# Patient Record
Sex: Male | Born: 1960 | Race: White | Hispanic: No | Marital: Married | Smoking: Never smoker
Health system: Southern US, Community
[De-identification: ages and names within clinical notes are randomized; demographics above are authoritative.]

## PROBLEM LIST (undated history)

## (undated) DIAGNOSIS — Z86010 Personal history of colonic polyps: Secondary | ICD-10-CM

## (undated) DIAGNOSIS — R809 Proteinuria, unspecified: Secondary | ICD-10-CM

## (undated) DIAGNOSIS — E119 Type 2 diabetes mellitus without complications: Secondary | ICD-10-CM

## (undated) DIAGNOSIS — I10 Essential (primary) hypertension: Secondary | ICD-10-CM

## (undated) DIAGNOSIS — E785 Hyperlipidemia, unspecified: Secondary | ICD-10-CM

## (undated) DIAGNOSIS — B019 Varicella without complication: Secondary | ICD-10-CM

## (undated) DIAGNOSIS — N200 Calculus of kidney: Secondary | ICD-10-CM

## (undated) HISTORY — DX: Calculus of kidney: N20.0

## (undated) HISTORY — DX: Type 2 diabetes mellitus without complications: E11.9

## (undated) HISTORY — DX: Proteinuria, unspecified: R80.9

## (undated) HISTORY — DX: Essential (primary) hypertension: I10

## (undated) HISTORY — DX: Personal history of colonic polyps: Z86.010

## (undated) HISTORY — PX: HERNIA REPAIR: SHX51

## (undated) HISTORY — DX: Hyperlipidemia, unspecified: E78.5

## (undated) HISTORY — DX: Varicella without complication: B01.9

---

## 2007-01-28 ENCOUNTER — Emergency Department: Payer: Self-pay | Admitting: Emergency Medicine

## 2007-05-27 ENCOUNTER — Ambulatory Visit: Payer: Self-pay | Admitting: Internal Medicine

## 2008-01-15 ENCOUNTER — Observation Stay: Payer: Self-pay | Admitting: Specialist

## 2008-01-17 ENCOUNTER — Emergency Department: Payer: Self-pay | Admitting: Unknown Physician Specialty

## 2008-01-23 ENCOUNTER — Emergency Department: Payer: Self-pay | Admitting: Internal Medicine

## 2008-02-06 ENCOUNTER — Ambulatory Visit: Payer: Self-pay | Admitting: Specialist

## 2008-02-15 ENCOUNTER — Ambulatory Visit: Payer: Self-pay | Admitting: Specialist

## 2008-03-04 ENCOUNTER — Ambulatory Visit: Payer: Self-pay | Admitting: Specialist

## 2009-02-14 DIAGNOSIS — Z86010 Personal history of colonic polyps: Secondary | ICD-10-CM

## 2009-02-14 HISTORY — DX: Personal history of colonic polyps: Z86.010

## 2009-04-03 ENCOUNTER — Ambulatory Visit: Payer: Self-pay | Admitting: Gastroenterology

## 2009-04-03 LAB — HM COLONOSCOPY

## 2009-10-17 DIAGNOSIS — E119 Type 2 diabetes mellitus without complications: Secondary | ICD-10-CM

## 2009-10-17 DIAGNOSIS — E1129 Type 2 diabetes mellitus with other diabetic kidney complication: Secondary | ICD-10-CM | POA: Insufficient documentation

## 2009-10-17 HISTORY — DX: Type 2 diabetes mellitus without complications: E11.9

## 2009-11-28 DIAGNOSIS — E785 Hyperlipidemia, unspecified: Secondary | ICD-10-CM

## 2009-11-28 HISTORY — DX: Hyperlipidemia, unspecified: E78.5

## 2011-03-09 ENCOUNTER — Ambulatory Visit: Payer: Self-pay | Admitting: Unknown Physician Specialty

## 2011-04-08 ENCOUNTER — Ambulatory Visit: Payer: Self-pay | Admitting: Unknown Physician Specialty

## 2011-04-09 LAB — PATHOLOGY REPORT

## 2011-08-03 DIAGNOSIS — N200 Calculus of kidney: Secondary | ICD-10-CM

## 2011-08-03 HISTORY — DX: Calculus of kidney: N20.0

## 2011-11-15 DIAGNOSIS — R748 Abnormal levels of other serum enzymes: Secondary | ICD-10-CM | POA: Insufficient documentation

## 2011-12-03 ENCOUNTER — Emergency Department: Payer: Self-pay | Admitting: Unknown Physician Specialty

## 2011-12-03 ENCOUNTER — Ambulatory Visit: Payer: Self-pay | Admitting: Emergency Medicine

## 2011-12-03 LAB — URINALYSIS, COMPLETE
Glucose,UR: 50 mg/dL (ref 0–75)
Leukocyte Esterase: NEGATIVE
RBC,UR: 494 /HPF (ref 0–5)
Specific Gravity: 1.023 (ref 1.003–1.030)
Squamous Epithelial: NONE SEEN
WBC UR: 68 /HPF (ref 0–5)

## 2011-12-03 LAB — CBC
HGB: 14.3 g/dL (ref 13.0–18.0)
MCH: 27.2 pg (ref 26.0–34.0)
Platelet: 199 10*3/uL (ref 150–440)

## 2011-12-03 LAB — BASIC METABOLIC PANEL
Anion Gap: 9 (ref 7–16)
Calcium, Total: 8.8 mg/dL (ref 8.5–10.1)
Co2: 24 mmol/L (ref 21–32)
EGFR (African American): 58 — ABNORMAL LOW
EGFR (Non-African Amer.): 50 — ABNORMAL LOW
Glucose: 179 mg/dL — ABNORMAL HIGH (ref 65–99)
Osmolality: 286 (ref 275–301)
Sodium: 139 mmol/L (ref 136–145)

## 2011-12-05 LAB — URINE CULTURE

## 2012-05-25 ENCOUNTER — Ambulatory Visit: Payer: Self-pay

## 2012-05-25 LAB — URINALYSIS, COMPLETE
Bacteria: NEGATIVE
Bilirubin,UR: NEGATIVE
Ketone: NEGATIVE
Ph: 5 (ref 4.5–8.0)
Specific Gravity: >=1.03 (ref 1.003–1.030)
WBC UR: NONE SEEN /HPF (ref 0–5)

## 2012-06-08 ENCOUNTER — Ambulatory Visit: Payer: Self-pay | Admitting: Urology

## 2012-06-15 ENCOUNTER — Ambulatory Visit: Payer: Self-pay | Admitting: Anesthesiology

## 2012-06-15 LAB — BASIC METABOLIC PANEL
Calcium, Total: 9.3 mg/dL (ref 8.5–10.1)
Chloride: 106 mmol/L (ref 98–107)
Co2: 26 mmol/L (ref 21–32)
Creatinine: 2.04 mg/dL — ABNORMAL HIGH (ref 0.60–1.30)
EGFR (African American): 42 — ABNORMAL LOW
Glucose: 128 mg/dL — ABNORMAL HIGH (ref 65–99)
Osmolality: 284 (ref 275–301)
Potassium: 4.6 mmol/L (ref 3.5–5.1)

## 2012-06-15 LAB — HEMOGLOBIN: HGB: 13.4 g/dL (ref 13.0–18.0)

## 2012-06-19 ENCOUNTER — Ambulatory Visit: Payer: Self-pay | Admitting: Urology

## 2012-06-19 HISTORY — PX: KIDNEY STONE SURGERY: SHX686

## 2012-07-10 ENCOUNTER — Ambulatory Visit: Payer: Self-pay | Admitting: Family Medicine

## 2012-09-05 ENCOUNTER — Ambulatory Visit: Payer: Self-pay | Admitting: Urology

## 2012-10-03 ENCOUNTER — Ambulatory Visit: Payer: Self-pay | Admitting: Urology

## 2012-10-03 LAB — BASIC METABOLIC PANEL
BUN: 28 mg/dL — ABNORMAL HIGH (ref 7–18)
Co2: 30 mmol/L (ref 21–32)
Creatinine: 1.49 mg/dL — ABNORMAL HIGH (ref 0.60–1.30)
EGFR (Non-African Amer.): 53 — ABNORMAL LOW
Sodium: 137 mmol/L (ref 136–145)

## 2012-10-03 LAB — HEMOGLOBIN A1C: Hemoglobin A1C: 6.9 % — ABNORMAL HIGH (ref 4.2–6.3)

## 2012-10-05 ENCOUNTER — Ambulatory Visit: Payer: Self-pay | Admitting: Urology

## 2013-07-28 ENCOUNTER — Ambulatory Visit: Payer: Self-pay | Admitting: Family Medicine

## 2013-07-28 LAB — RAPID STREP-A WITH REFLX: Micro Text Report: NEGATIVE

## 2014-02-12 LAB — HEPATIC FUNCTION PANEL
ALK PHOS: 60 U/L (ref 25–125)
ALT: 37 U/L (ref 10–40)
AST: 27 U/L (ref 14–40)
BILIRUBIN, TOTAL: 0.6 mg/dL

## 2014-02-12 LAB — LIPID PANEL
ALBUMIN: 4.5
Albumin/Globulin Ratio: 1.6
BUN / CREAT RATIO: 19
CALCIUM: 9.5
CHLORIDE: 101
CO2: 23
Cholesterol: 186 mg/dL (ref 0–200)
EGFR (Non-African Amer.): 50
GFR CALC AF AMER: 58
Globulin, Total: 2.9
HDL: 30 mg/dL — AB (ref 35–70)
LDL CALC: 113 mg/dL
MCH: 27.8
MCHC: 33.9
MCV: 82
PH, URINE: 6
RBC: 5.32
RDW: 14.5
SPEC GRAV UA: 1.03
Triglycerides: 215 mg/dL — AB (ref 40–160)
Urobilinogen, UA: NORMAL
VLDL Cholesterol Cal: 43

## 2014-02-12 LAB — BASIC METABOLIC PANEL
BUN: 29 mg/dL — AB (ref 4–21)
Creatinine: 1.6 mg/dL — AB (ref 0.6–1.3)
GLUCOSE: 109 mg/dL
Potassium: 4.6 mmol/L (ref 3.4–5.3)
SODIUM: 140 mmol/L (ref 137–147)

## 2014-02-12 LAB — CBC AND DIFFERENTIAL
HEMATOCRIT: 44 % (ref 41–53)
HEMOGLOBIN: 14.8 g/dL (ref 13.5–17.5)
Platelets: 214 10*3/uL (ref 150–399)
WBC: 6.1 10*3/mL

## 2014-02-12 LAB — PROTIME-INR: Protime: 7.4 seconds — AB (ref 10.0–13.8)

## 2014-02-12 LAB — PSA: PSA: 0.9

## 2014-10-31 IMAGING — CR DG ELBOW COMPLETE 3+V*L*
1 series · 4 of 4 positions shown · non-contrast
Comparison: none

REASON FOR EXAM: pain
COMMENTS:

PROCEDURE:     KDR - KDXR ELBOW LT COMP W/OBLIQUES  - July 10, 2012 [DATE]
RESULT:     Left elbow images demonstrate no definite fracture, dislocation
or radiopaque foreign body.

[Series 1: lat · 0.17mm/px · 4 of 4 slices shown]
[im 1/4]
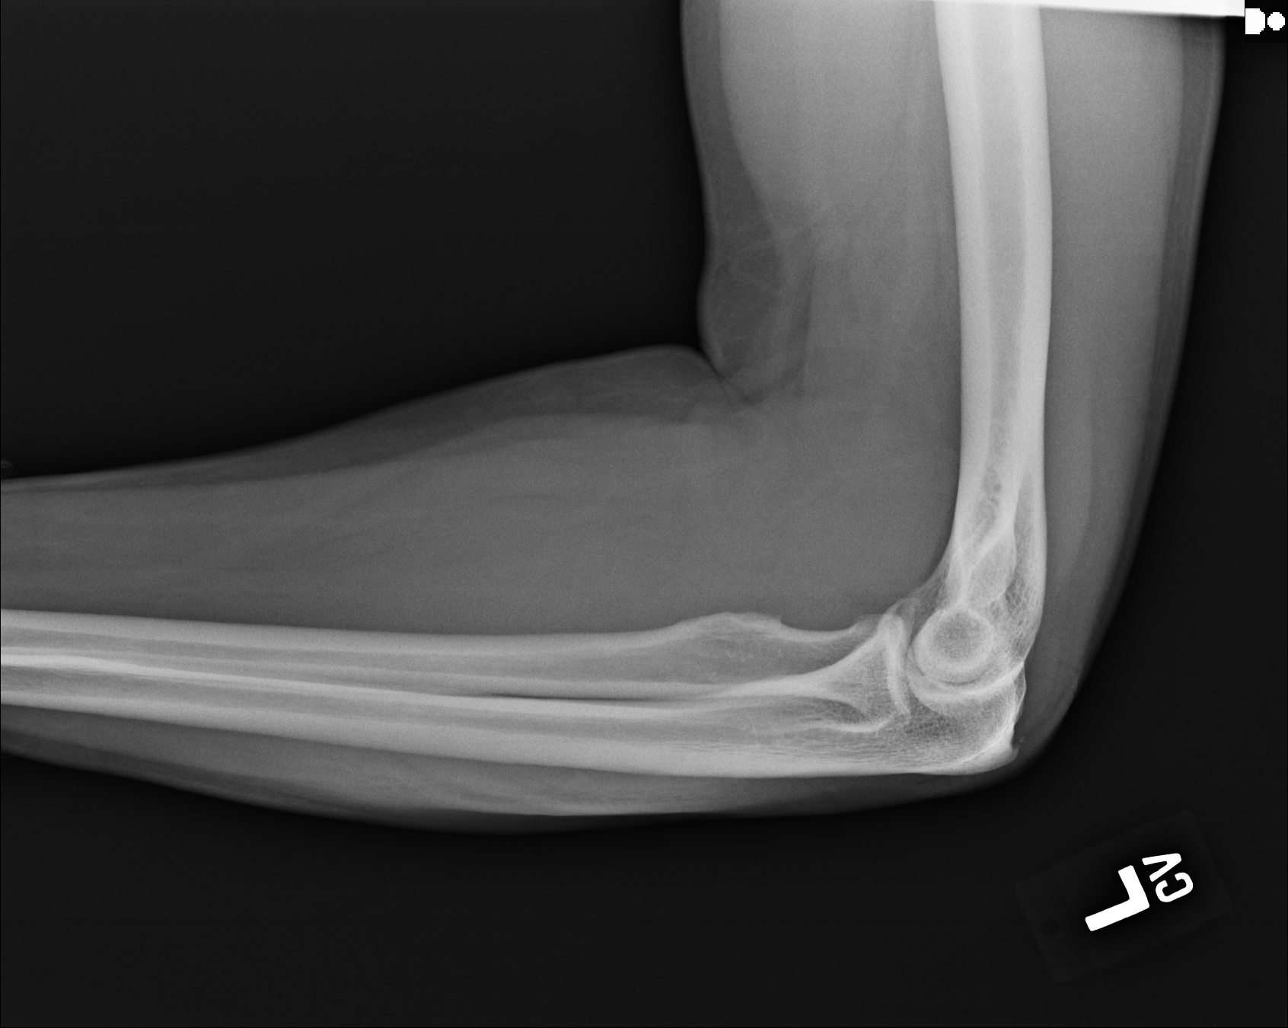
[im 2/4]
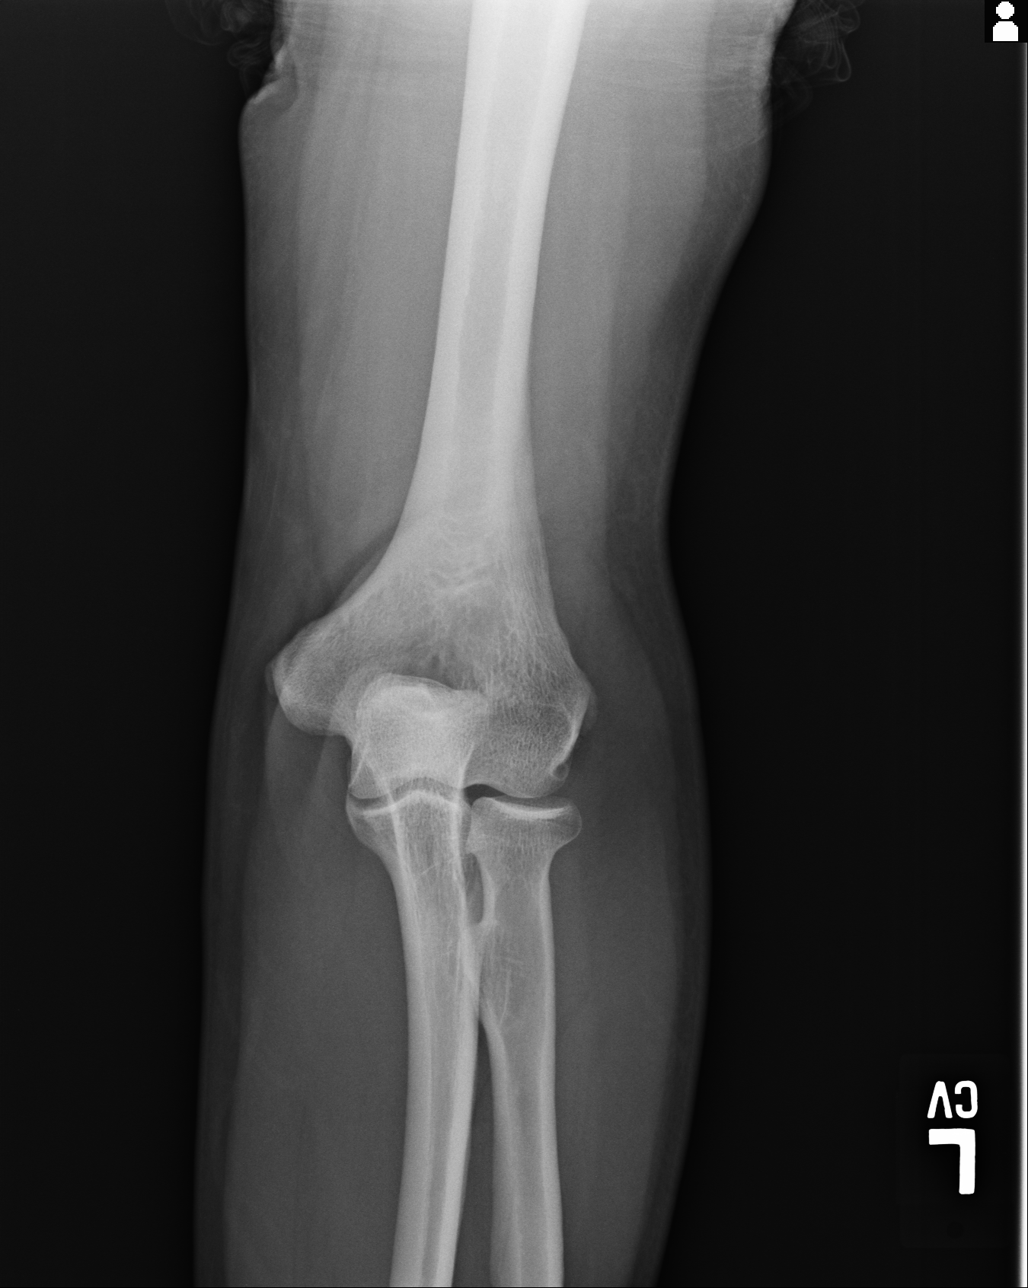
[im 3/4]
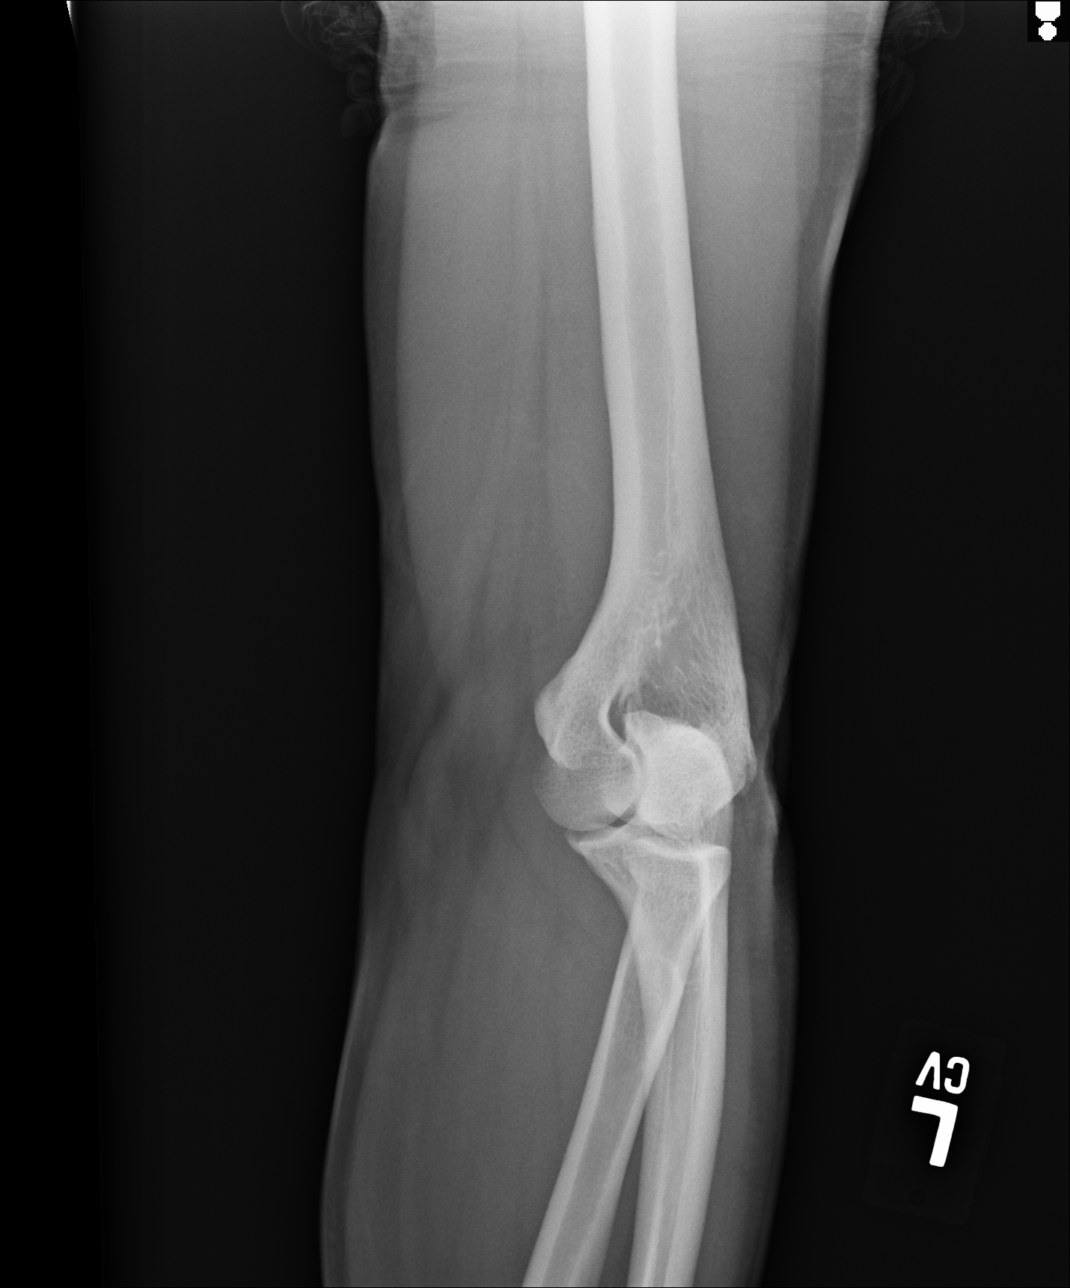
[im 4/4]
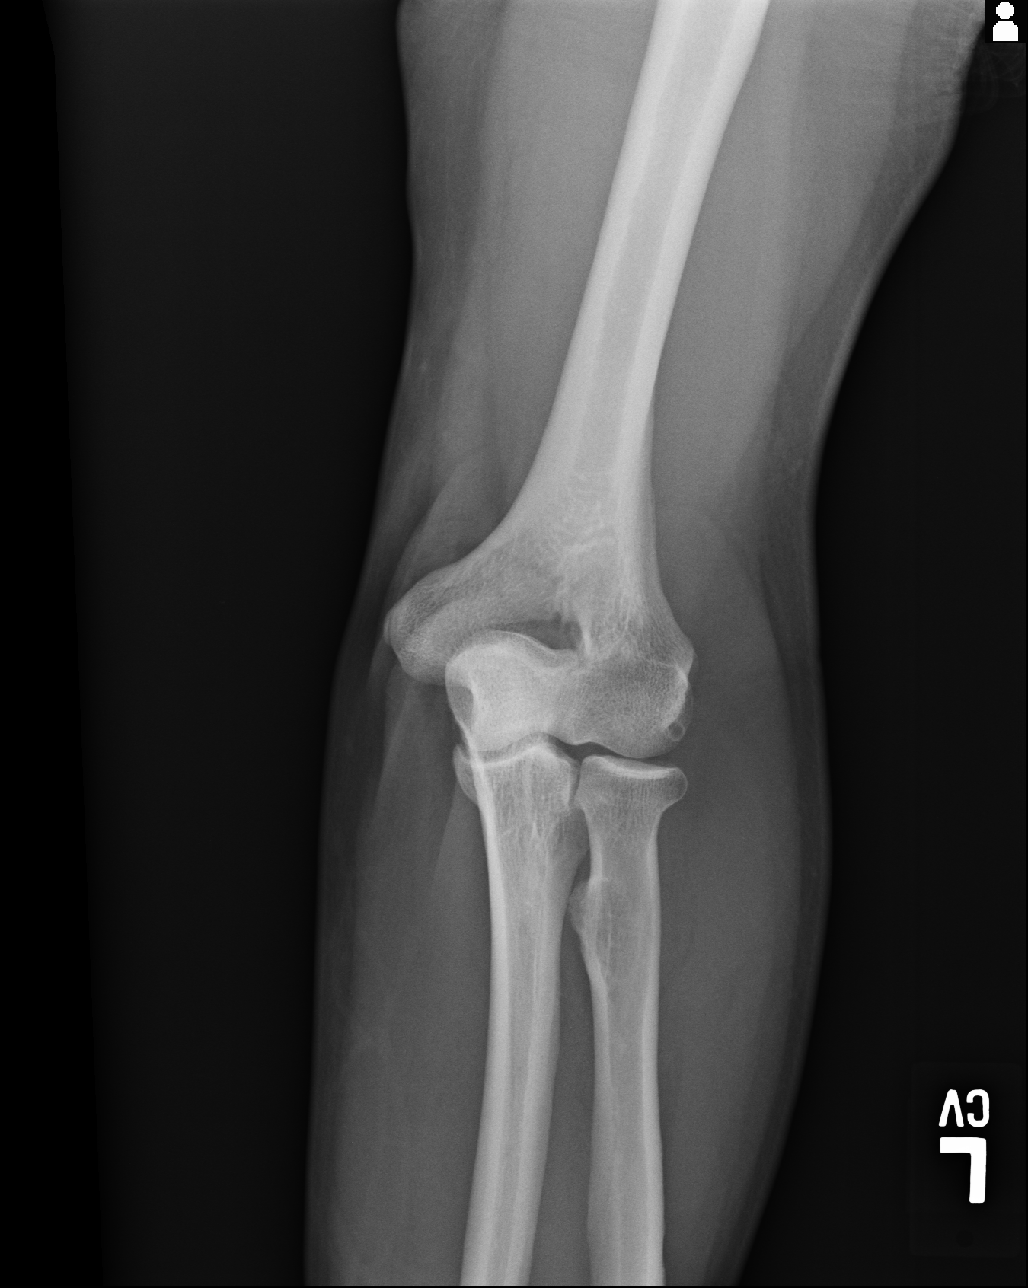

[4 of 4 positions shown; findings below may reference images not displayed]

IMPRESSION: Please see above.

[REDACTED]

## 2014-10-31 IMAGING — CR DG SHOULDER 3+V*L*
1 series · 5 of 5 positions shown · non-contrast
Comparison: none

REASON FOR EXAM: pain
COMMENTS:

PROCEDURE:     KDR - KDXR SHOULDER LEFT COMPLETE  - July 10, 2012 [DATE]
RESULT:     Images of the left shoulder demonstrate no evidence of fracture,
dislocation, lytic or sclerotic mass or bony destruction.

[Series 1: internal rotate · 0.17mm/px · 5 of 5 slices shown]
[im 1/5]
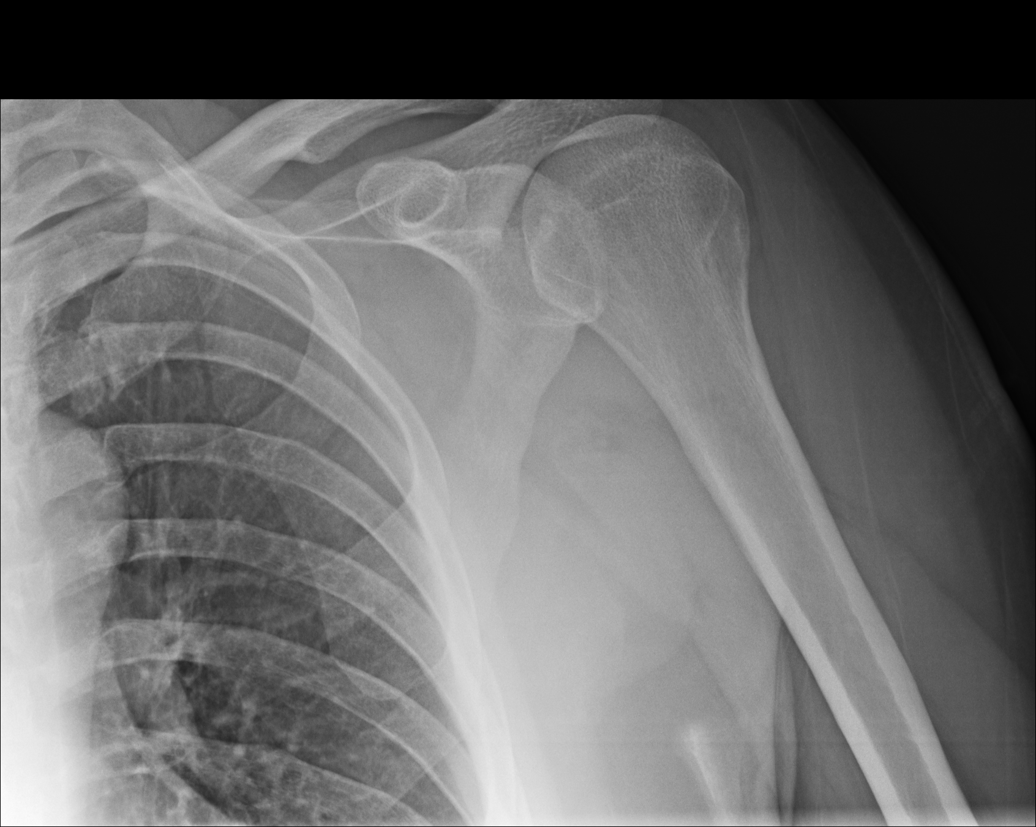
[im 2/5]
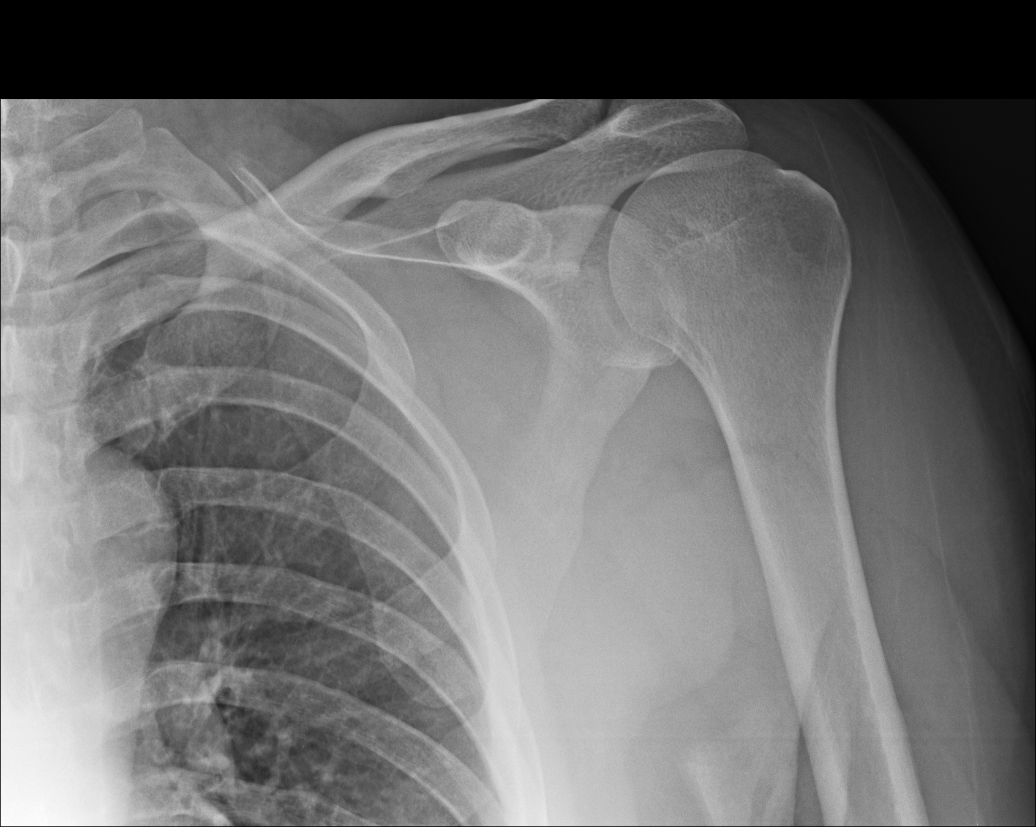
[im 3/5]
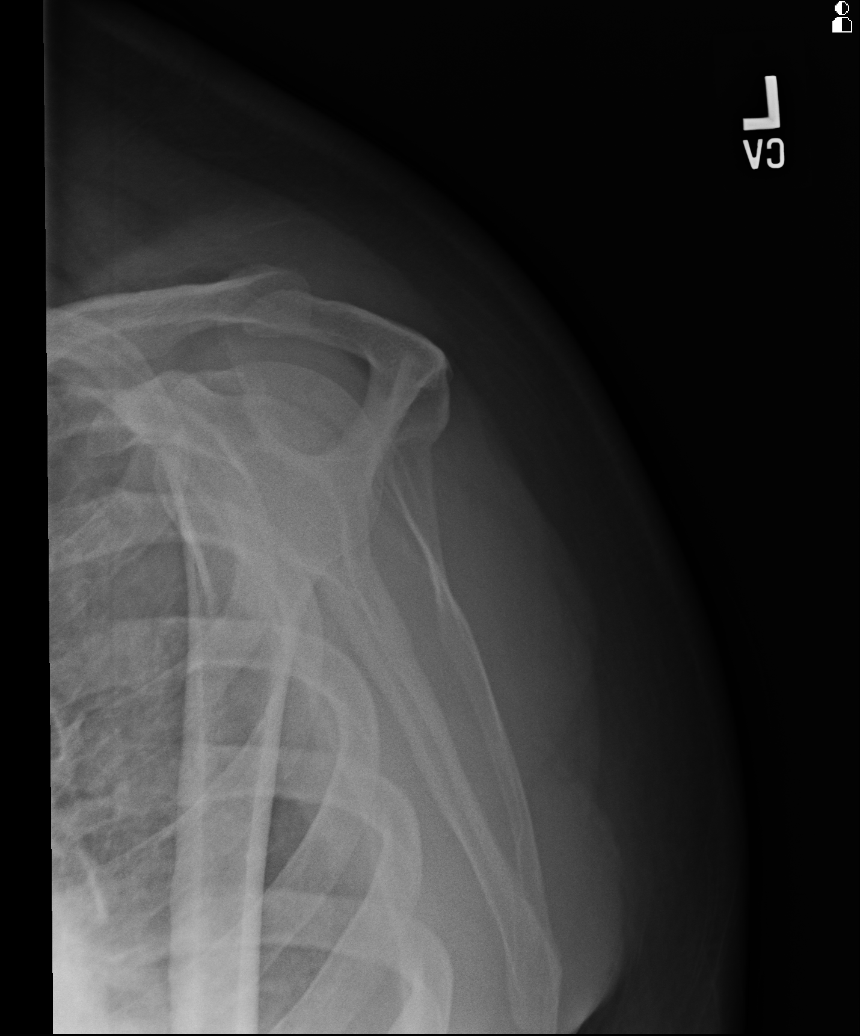
[im 4/5]
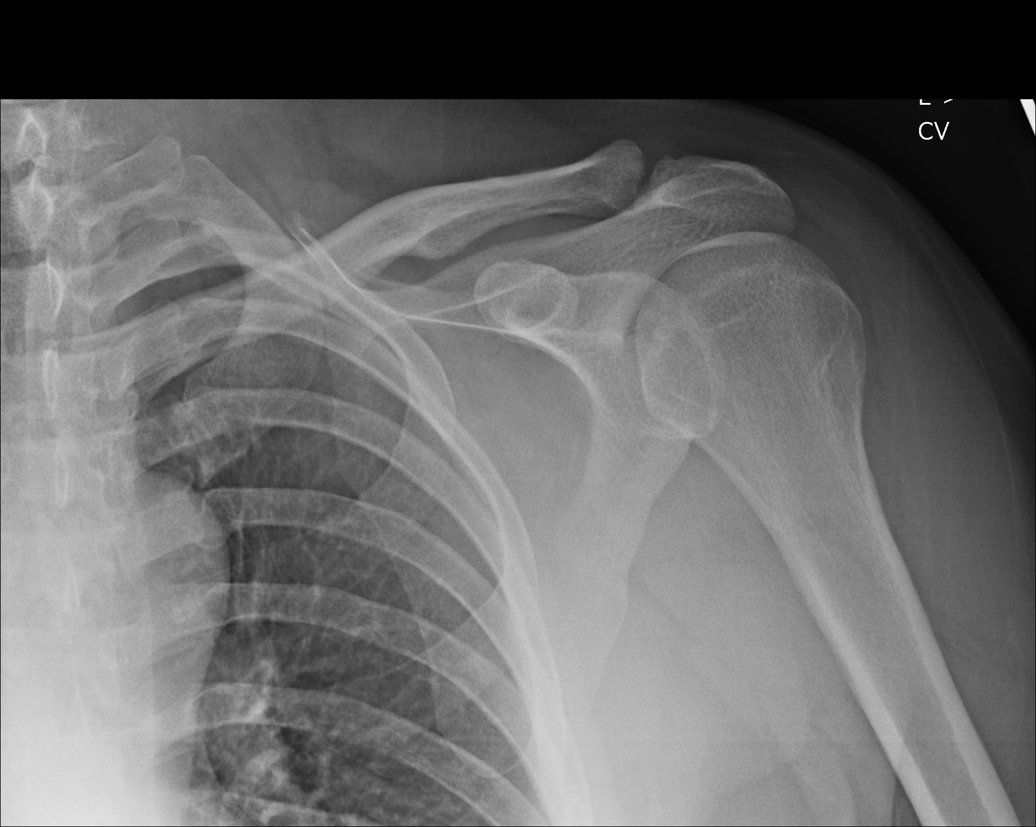
[im 5/5]
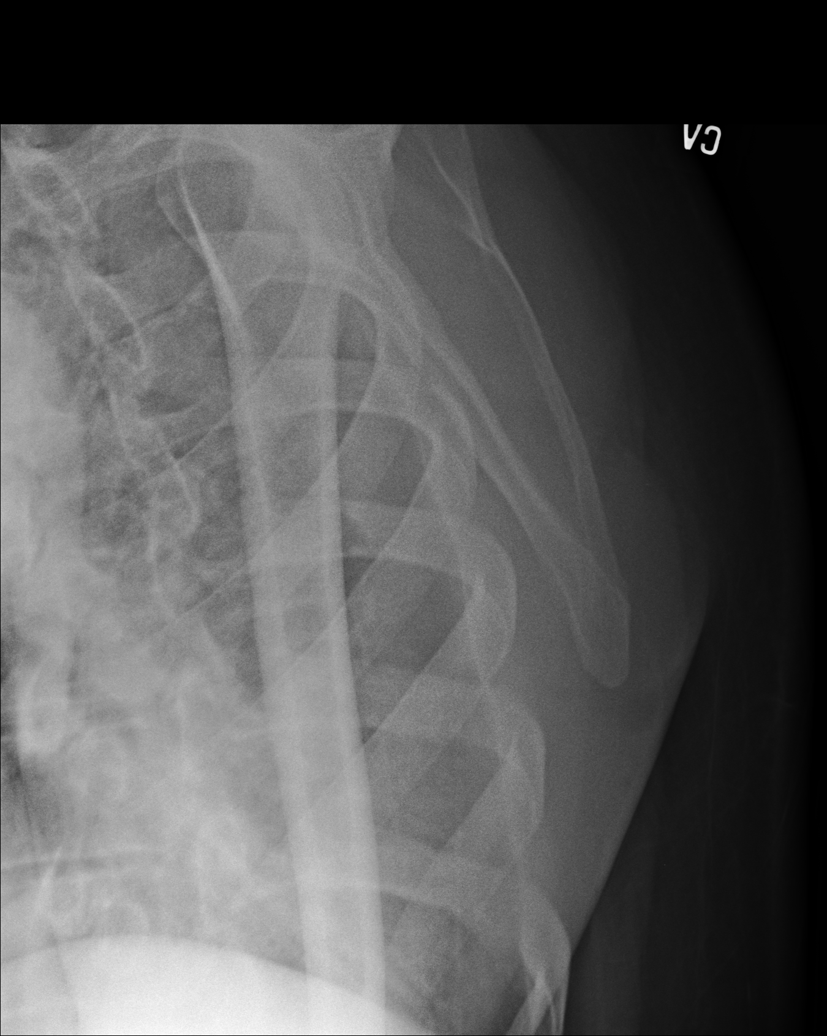

[5 of 5 positions shown; findings below may reference images not displayed]

IMPRESSION: Unremarkable appearance of the left shoulder.

[REDACTED]

## 2014-11-19 NOTE — H&P (Signed)
PATIENT NAME:  Jack LedgerCOUNTS, Jermani J MR#:  [id number] DATE OF BIRTH:  [birth year]  DATE OF ADMISSION:  06/19/2012  CHIEF COMPLAINT: Left flank pain.   HISTORY OF PRESENT ILLNESS: Mr. Humphrey is a 54 year old white male with a one-week history of left flank pain. Renal ultrasound was performed 11/07 indicating mild left hydronephrosis. He has a history of uric acid stone disease. He was also noted to have elevated creatinine of 2.3 mg/dL on 28/4111/04. He comes in now for left ureteroscopic ureterolithotomy with holmium laser lithotripsy.   ALLERGIES: No drug allergies.   CURRENT MEDICATIONS: Percocet.   PAST SURGICAL HISTORY:  1. L5 disk repair in 1998. 2. Colonoscopy with polyp removal 2009.   SOCIAL HISTORY: He denied tobacco or alcohol use.   FAMILY HISTORY: Remarkable for hypertension, hyperlipidemia, thyroid disease, and arthritis.   PAST AND CURRENT MEDICAL CONDITIONS: Negative.   REVIEW OF SYSTEMS: Patient denies chest pain, shortness breath, diabetes, or stroke.   PHYSICAL EXAMINATION:  GENERAL: Obese white male in no distress.   HEENT: Sclerae were clear. Pupils were equally round and reactive to light and accommodation. Extraocular movements are intact.   NECK: Supple. No palpable cervical adenopathy.   LUNGS: Clear to auscultation.   CARDIOVASCULAR: Regular rhythm and rate without audible murmurs.   ABDOMEN: Soft, nontender abdomen.   GU/RECTAL: Deferred.   NEUROMUSCULAR: Patient was alert and oriented x3.   IMPRESSION: Left ureterolithiasis with history of uric acid stone disease.   PLAN: Left ureteroscopic ureterolithotomy with holmium laser lithotripsy.   ____________________________ Suszanne ConnersMichael R. Evelene CroonWolff, MD mrw:cms D: 06/16/2012 08:40:43 ET T: 06/16/2012 08:58:12 ET JOB#: [id number]  cc: Suszanne ConnersMichael R. Evelene CroonWolff, MD, <Dictator> Orson ApeMICHAEL R Madiha Bambrick MD ELECTRONICALLY SIGNED 06/19/2012 7:48

## 2014-11-19 NOTE — Op Note (Signed)
PATIENT NAME:  Jack LedgerCOUNTS, Jack Griffin MR#:  [id number] DATE OF BIRTH:  Mar 10, 1961  DATE OF PROCEDURE:  06/19/2012  PREOPERATIVE DIAGNOSIS: Left ureterolithiasis.   POSTOPERATIVE DIAGNOSIS: Left ureterolithiasis.   PROCEDURES:  1. Left ureteroscopy.  2. Cystoscopy with double pigtail stent placement. 3. Retrograde pyelogram.  4. Fluoroscopy.   SURGEON: Anola GurneyMichael Wolff, MD  ANESTHETIST: Dr. Noralyn Pickarroll  ANESTHESIA: General.   INDICATIONS: See the dictated history and physical. After informed consent, the patient requests the above procedure.   OPERATIVE SUMMARY: After adequate general anesthesia had been obtained, the patient was placed into the dorsal lithotomy position and the perineum was prepped and draped in the usual fashion. The 21 French cystoscope was coupled with the camera and then visually advanced into the bladder. The bladder was thoroughly inspected. Both ureteral orifices were identified. There were numerous less than 1 mm stone fragments present within the bladder. There was cloudy efflux from the left orifice. At this point, an 8 [place] cone-tipped ureteral catheter was advanced through the left orifice and retrograde pyelogram performed using fluoroscopy. The ureter and intrarenal collecting structures were dilated. There was some narrowing near the distal ureter but filling defect was not seen. At this point, a 0.035 Glidewire was advanced up the ureter under fluoroscopic guidance. A 5 mm balloon dilating catheter was positioned in the intramural ureter and the ureter was dilated for five minutes. The balloon was deflated and dilating catheter was removed taking care to leave the guidewire in position. At this point, an access sheath was placed and the flexible ureteroscope advanced through the access sheath into the ureter. The ureteroscope was easily advanced into the intrarenal collecting structures. Each calyx was sequentially visualized using the contrast to localize each calyx. A  definite stone was not identified in the renal collecting system, however, there was significant purulence and debris present. The ureteroscope was then backed out down the ureter and inspection did not reveal any stones. There was significant stone debris present throughout the course of the ureter. At this point, the ureteroscope and access sheath were removed. The cystoscope was then back loaded over the guidewire. A 6 x 26 cm double pigtail stent was then advanced over the guidewire and positioned in the ureter. The guidewire was then removed taking care to leave the stent in position. The cystoscope was also removed. 10 mL of viscous Xylocaine was instilled within the urethra. A B and O suppository was placed. The procedure was then terminated and the patient was transferred to the recovery room in stable condition. ____________________________ Suszanne ConnersMichael R. Evelene CroonWolff, MD [email or web address]:slb D: 06/19/2012 12:43:57 ET T: 06/19/2012 12:51:49 ET JOB#: [id number]  cc: Suszanne ConnersMichael R. Evelene CroonWolff, MD, <Dictator> Orson ApeMICHAEL R WOLFF MD ELECTRONICALLY SIGNED 06/19/2012 16:55

## 2014-12-27 IMAGING — US US RENAL KIDNEY
1 series · 14 of 25 positions shown · non-contrast
Comparison: none

REASON FOR EXAM: kidney stones
COMMENTS:

[Series 1: us renal kidney · 0.31mm/px · 14 of 54 slices shown]
[im 1/54]
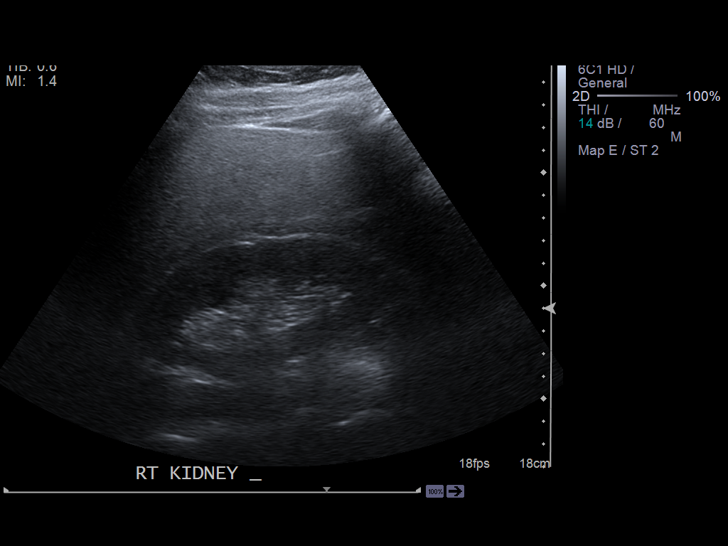
[im 5/54]
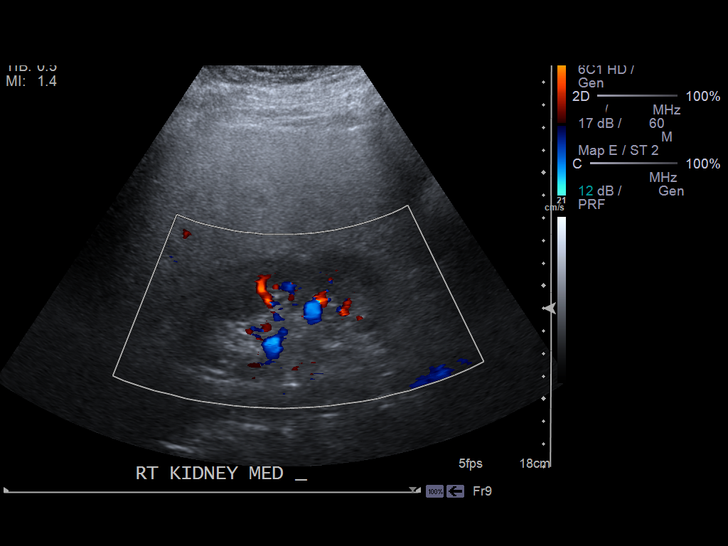
[im 9/54]
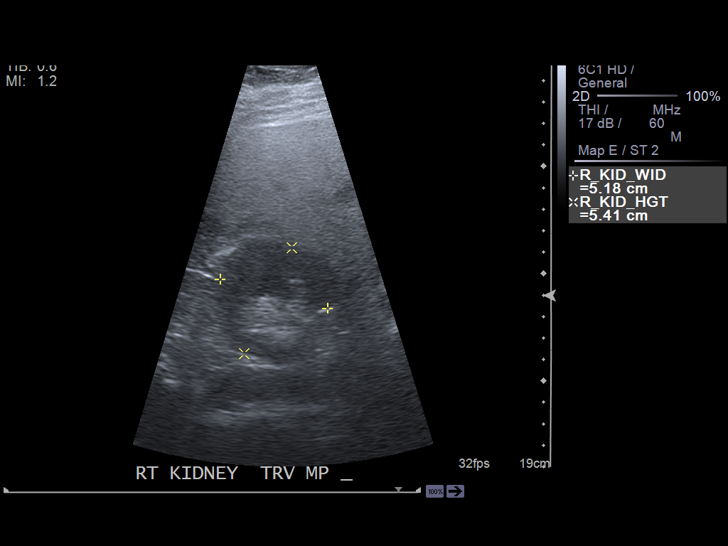
[im 14/54]
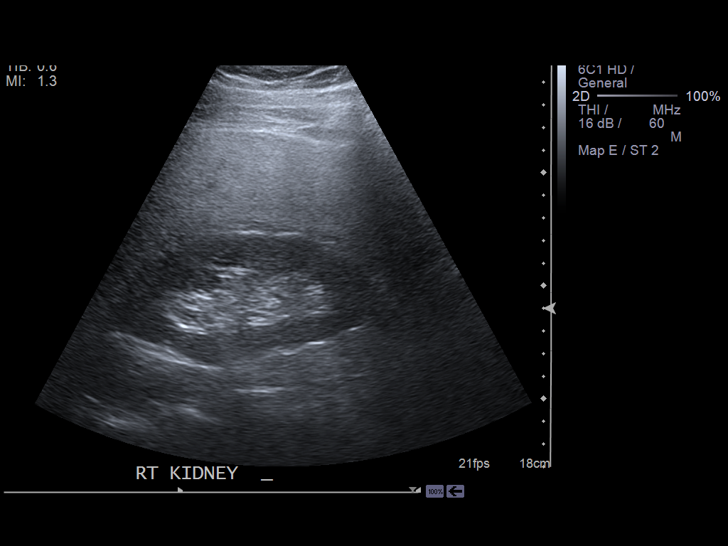
[im 18/54]
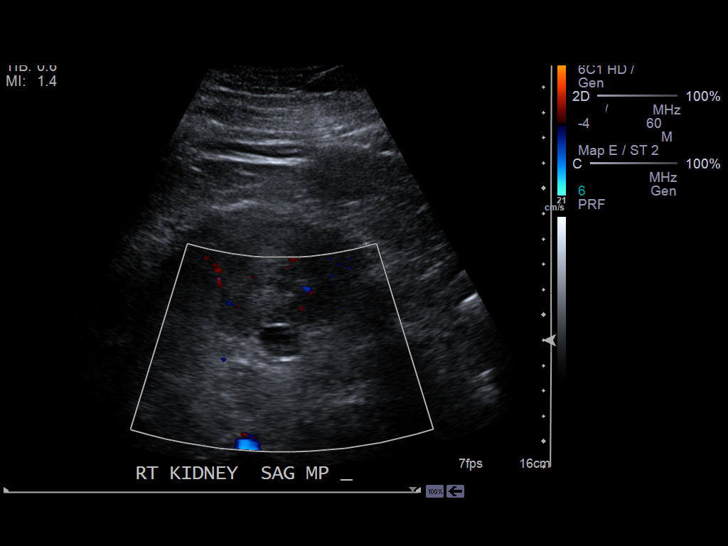
[im 20/54]
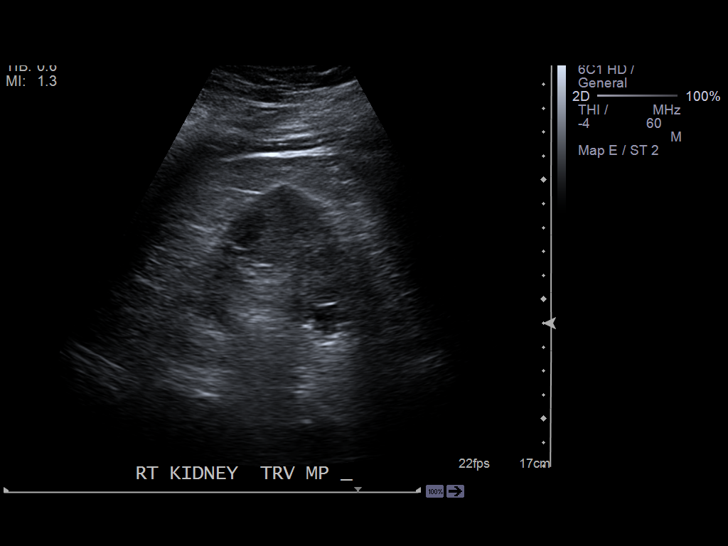
[im 25/54]
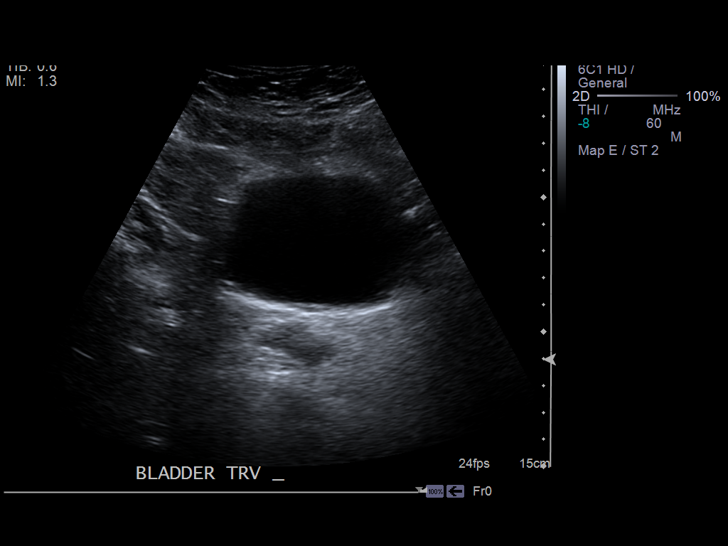
[im 29/54]
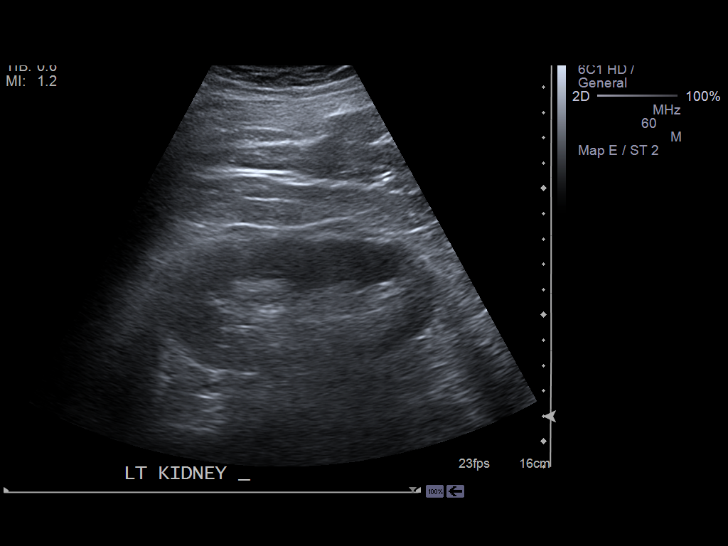
[im 34/54]
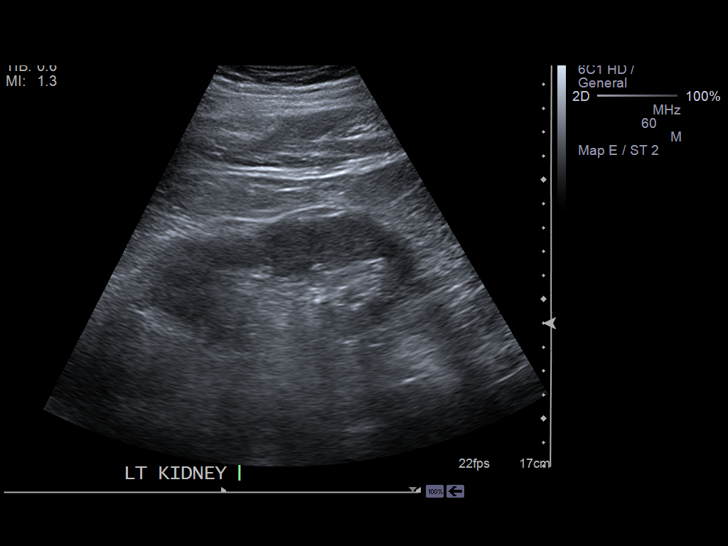
[im 36/54]
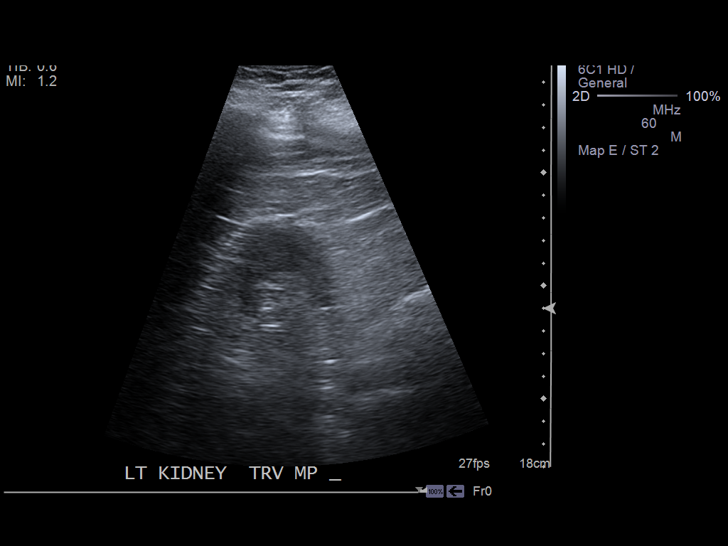
[im 40/54]
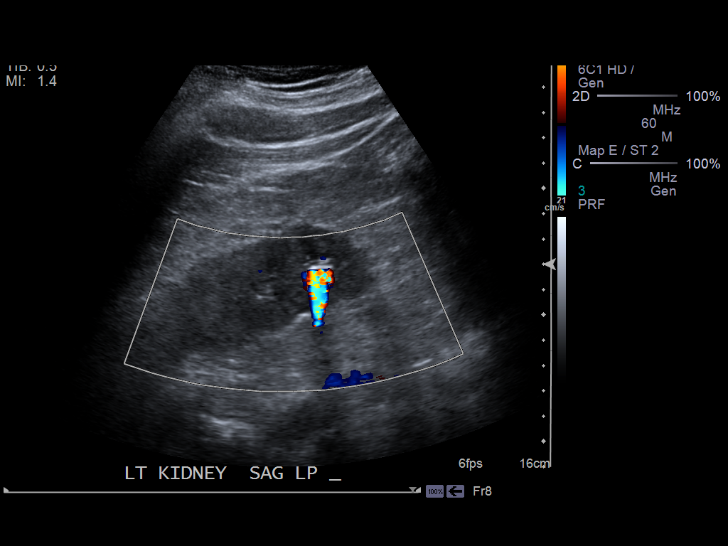
[im 45/54]
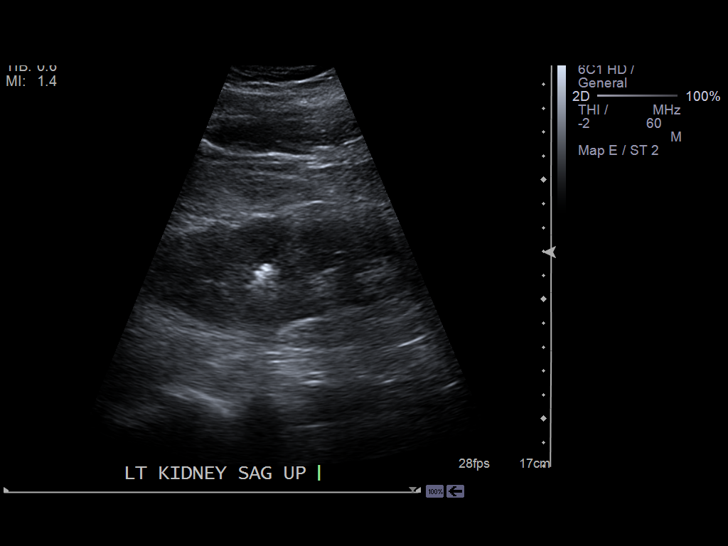
[im 49/54]
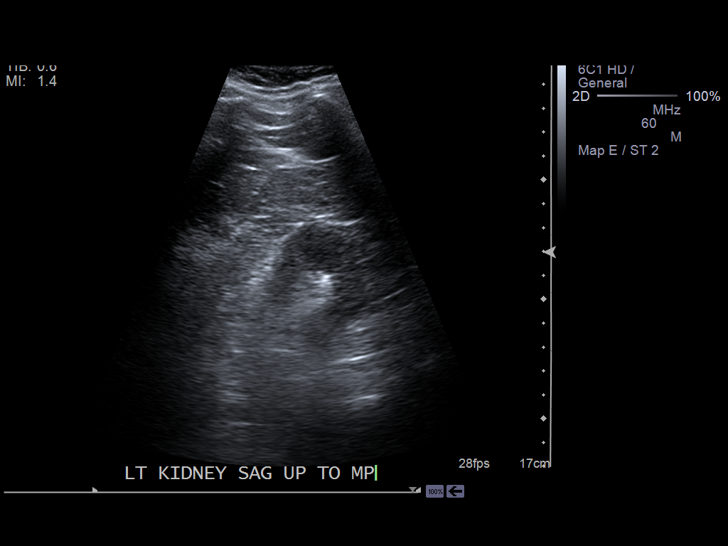
[im 54/54]
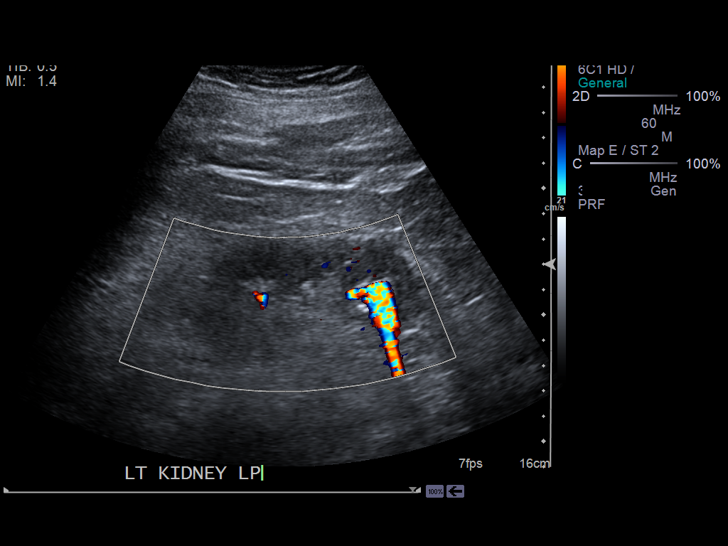

[14 of 25 positions shown; findings below may reference images not displayed]

PROCEDURE:     US  - US KIDNEY  - September 05, 2012  [DATE]

RESULT:     History: Kidney stones.

Comparison Study: Prior renal ultrasound 06/08/2012. Right kidney measures
11.8 cm in maximum diameter, the left 11.2 cm. Echogenicity is normal.
Bilateral renal flow is present. Small cyst is noted in the right kidney.
This may contain a debris. This is stable in appearance. Left
nephrolithiasis is again noted with prominent stones in the left lower
kidney. Largest measures approximately 10 to 11 mm. No hydronephrosis.
IMPRESSION: Persistent left nephrolithiasis.

## 2016-01-21 ENCOUNTER — Encounter: Payer: Self-pay | Admitting: *Deleted

## 2016-01-21 DIAGNOSIS — N200 Calculus of kidney: Secondary | ICD-10-CM | POA: Insufficient documentation

## 2016-01-21 DIAGNOSIS — K760 Fatty (change of) liver, not elsewhere classified: Secondary | ICD-10-CM | POA: Insufficient documentation

## 2016-01-21 DIAGNOSIS — M79603 Pain in arm, unspecified: Secondary | ICD-10-CM | POA: Insufficient documentation

## 2016-01-21 DIAGNOSIS — Z8619 Personal history of other infectious and parasitic diseases: Secondary | ICD-10-CM | POA: Insufficient documentation

## 2016-01-21 DIAGNOSIS — R809 Proteinuria, unspecified: Secondary | ICD-10-CM | POA: Insufficient documentation

## 2016-01-21 DIAGNOSIS — R1310 Dysphagia, unspecified: Secondary | ICD-10-CM | POA: Insufficient documentation

## 2016-01-21 DIAGNOSIS — I10 Essential (primary) hypertension: Secondary | ICD-10-CM | POA: Insufficient documentation

## 2016-01-21 DIAGNOSIS — R0789 Other chest pain: Secondary | ICD-10-CM | POA: Insufficient documentation
